# Patient Record
Sex: Male | Born: 1995 | Race: Black or African American | Hispanic: No | Marital: Single | Smoking: Never smoker
Health system: Southern US, Community
[De-identification: ages and names within clinical notes are randomized; demographics above are authoritative.]

---

## 2013-12-30 ENCOUNTER — Emergency Department (HOSPITAL_COMMUNITY)
Admission: EM | Admit: 2013-12-30 | Discharge: 2013-12-30 | Disposition: A | Discharge status: Home / Self Care | Payer: Managed Care, Other (non HMO) | Attending: Emergency Medicine | Admitting: Emergency Medicine

## 2013-12-30 ENCOUNTER — Encounter (HOSPITAL_COMMUNITY): Payer: Self-pay | Admitting: Emergency Medicine

## 2013-12-30 DIAGNOSIS — Y9389 Activity, other specified: Secondary | ICD-10-CM | POA: Diagnosis not present

## 2013-12-30 DIAGNOSIS — W268XXA Contact with other sharp object(s), not elsewhere classified, initial encounter: Secondary | ICD-10-CM | POA: Diagnosis not present

## 2013-12-30 DIAGNOSIS — Y9289 Other specified places as the place of occurrence of the external cause: Secondary | ICD-10-CM | POA: Diagnosis not present

## 2013-12-30 DIAGNOSIS — S86909A Unspecified injury of unspecified muscle(s) and tendon(s) at lower leg level, unspecified leg, initial encounter: Secondary | ICD-10-CM | POA: Insufficient documentation

## 2013-12-30 DIAGNOSIS — S91009A Unspecified open wound, unspecified ankle, initial encounter: Principal | ICD-10-CM

## 2013-12-30 DIAGNOSIS — S81009A Unspecified open wound, unspecified knee, initial encounter: Principal | ICD-10-CM | POA: Insufficient documentation

## 2013-12-30 DIAGNOSIS — S81809A Unspecified open wound, unspecified lower leg, initial encounter: Principal | ICD-10-CM

## 2013-12-30 DIAGNOSIS — S86921A Laceration of unspecified muscle(s) and tendon(s) at lower leg level, right leg, initial encounter: Secondary | ICD-10-CM

## 2013-12-30 DIAGNOSIS — S81811A Laceration without foreign body, right lower leg, initial encounter: Secondary | ICD-10-CM

## 2013-12-30 MED ORDER — LIDOCAINE-EPINEPHRINE (PF) 2 %-1:200000 IJ SOLN
10.0000 mL | Freq: Once | INTRAMUSCULAR | Status: AC
  Administered 2013-12-30: 10 mL
  Filled 2013-12-30: qty 20

## 2013-12-30 MED ORDER — TRAMADOL HCL 50 MG PO TABS
50.0000 mg | ORAL_TABLET | Freq: Four times a day (QID) | ORAL | Status: AC | PRN
Start: 2013-12-30 — End: ?

## 2013-12-30 NOTE — ED Provider Notes (Signed)
CSN: [id number]     Arrival date & time 12/30/13  1758 History  This chart was scribed for non-physician practitioner, Junius Finner, PA-C,working with Richardean Canal, MD, by Karle Plumber, ED Scribe. This patient was seen in room TR10C/TR10C and the patient's care was started at 6:51 PM.  Chief Complaint  Patient presents with  . Extremity Laceration   The history is provided by the patient. No language interpreter was used.   HPI Comments:  Billy Vasquez is a 18 y.o. male who presents to the Emergency Department complaining of a laceration to the anterior right lower extremity that occurred approximately two hours ago. He reports associated bleeding that has since resolved. Pt reports the pain is 0/10. He states he and a friend were "playing around" and he was cut with a straight-edge blade. He denies redness, numbness, tingling or weakness of the RLE. His last tetanus vaccination was two years ago. Pt has no allergies to any medication.  History reviewed. No pertinent past medical history. History reviewed. No pertinent past surgical history. No family history on file. History  Substance Use Topics  . Smoking status: Never Smoker   . Smokeless tobacco: Not on file  . Alcohol Use: No    Review of Systems  Skin: Positive for wound. Negative for color change.  Neurological: Negative for weakness and numbness.  All other systems reviewed and are negative.   Allergies  Review of patient's allergies indicates no known allergies.  Home Medications   Prior to Admission medications   Medication Sig Start Date End Date Taking? Authorizing Provider  traMADol (ULTRAM) 50 MG tablet Take 1 tablet (50 mg total) by mouth every 6 (six) hours as needed. 12/30/13   Junius Finner, PA-C   Triage Vitals: BP 131/85  Pulse 77  Temp(Src) 98.4 F (36.9 C) (Oral)  Resp 15  Ht  (1.727 m)  Wt 190 lb (86.183 kg)  BMI 28.90 kg/m2  SpO2 100% Physical Exam  Nursing note and vitals  reviewed. Constitutional: He is oriented to person, place, and time. He appears well-developed and well-nourished.  HENT:  Head: Normocephalic and atraumatic.  Eyes: EOM are normal.  Neck: Normal range of motion.  Cardiovascular: Normal rate.   Pulmonary/Chest: Effort normal.  Musculoskeletal: Normal range of motion.  Neurological: He is alert and oriented to person, place, and time.  Normal gait.  Skin: Skin is warm and dry.  7 cm laceration to anterior aspect of right lower leg.  Psychiatric: He has a normal mood and affect. His behavior is normal.    ED Course  Procedures (including critical care time) DIAGNOSTIC STUDIES: Oxygen Saturation is 100% on RA, normal by my interpretation.   COORDINATION OF CARE: 6:56 PM- Will suture laceration. Pt verbalizes understanding and agrees to plan.  LACERATION REPAIR PROCEDURE NOTE The patient's identification was confirmed and consent was obtained. This procedure was performed by Junius Finner, PA-C at 6:56 PM. Site: right anterior lower extremity Sterile procedures observed Anesthetic used (type and amt): Lidocaine 2% with Epinephrine (1 mL) Suture type/size: 4-0 Prolene Length: 7 cm # of Sutures: 7 Technique: simple, interrupted Complexity: simple Antibx ointment applied Tetanus UTD Site anesthetized, irrigated with NS, explored without evidence of foreign body, wound well approximated, site covered with dry, sterile dressing.  Patient tolerated procedure well without complications. Instructions for care discussed verbally and patient provided with additional written instructions for homecare and f/u.  Medications  lidocaine-EPINEPHrine (XYLOCAINE W/EPI) 2 %-1:200000 (PF) injection 10 mL (10 mLs  Infiltration Given 12/30/13 1829)   Labs Review Labs Reviewed - No data to display  Imaging Review No results found.   EKG Interpretation None      MDM   Final diagnoses:  Lower leg laceration involving tendon, right,  initial encounter    Pt presenting to ED with laceration to right lower leg.  Laceration able to be sutured closed.  Advised to f/u in 10 days for suture removal and wound recheck. Home care instructions provided. Return precautions provided. Pt verbalized understanding and agreement with tx plan.   I personally performed the services described in this documentation, which was scribed in my presence. The recorded information has been reviewed and is accurate.    Junius Finner, PA-C 12/30/13 2053

## 2013-12-30 NOTE — ED Provider Notes (Signed)
Medical screening examination/treatment/procedure(s) were performed by non-physician practitioner and as supervising physician I was immediately available for consultation/collaboration.   EKG Interpretation None        Kyllian Clingerman H Casimira Sutphin, MD 12/30/13 2349 

## 2013-12-30 NOTE — ED Notes (Signed)
Pt with laceration to rt lower leg from scalpel blade. Bleeding controlled with pressure.

## 2014-07-28 ENCOUNTER — Other Ambulatory Visit: Payer: Self-pay | Admitting: Occupational Medicine

## 2014-07-28 ENCOUNTER — Ambulatory Visit: Payer: Self-pay

## 2014-07-28 DIAGNOSIS — Z0000 Encounter for general adult medical examination without abnormal findings: Secondary | ICD-10-CM

## 2015-09-13 IMAGING — CR DG CHEST 2V
2 series · 2 of 2 positions shown · non-contrast
Comparison: None.

CLINICAL DATA: Mild congestion, cough, physical exam

EXAM:
CHEST  2 VIEW

[view not recorded (1 of 2)]
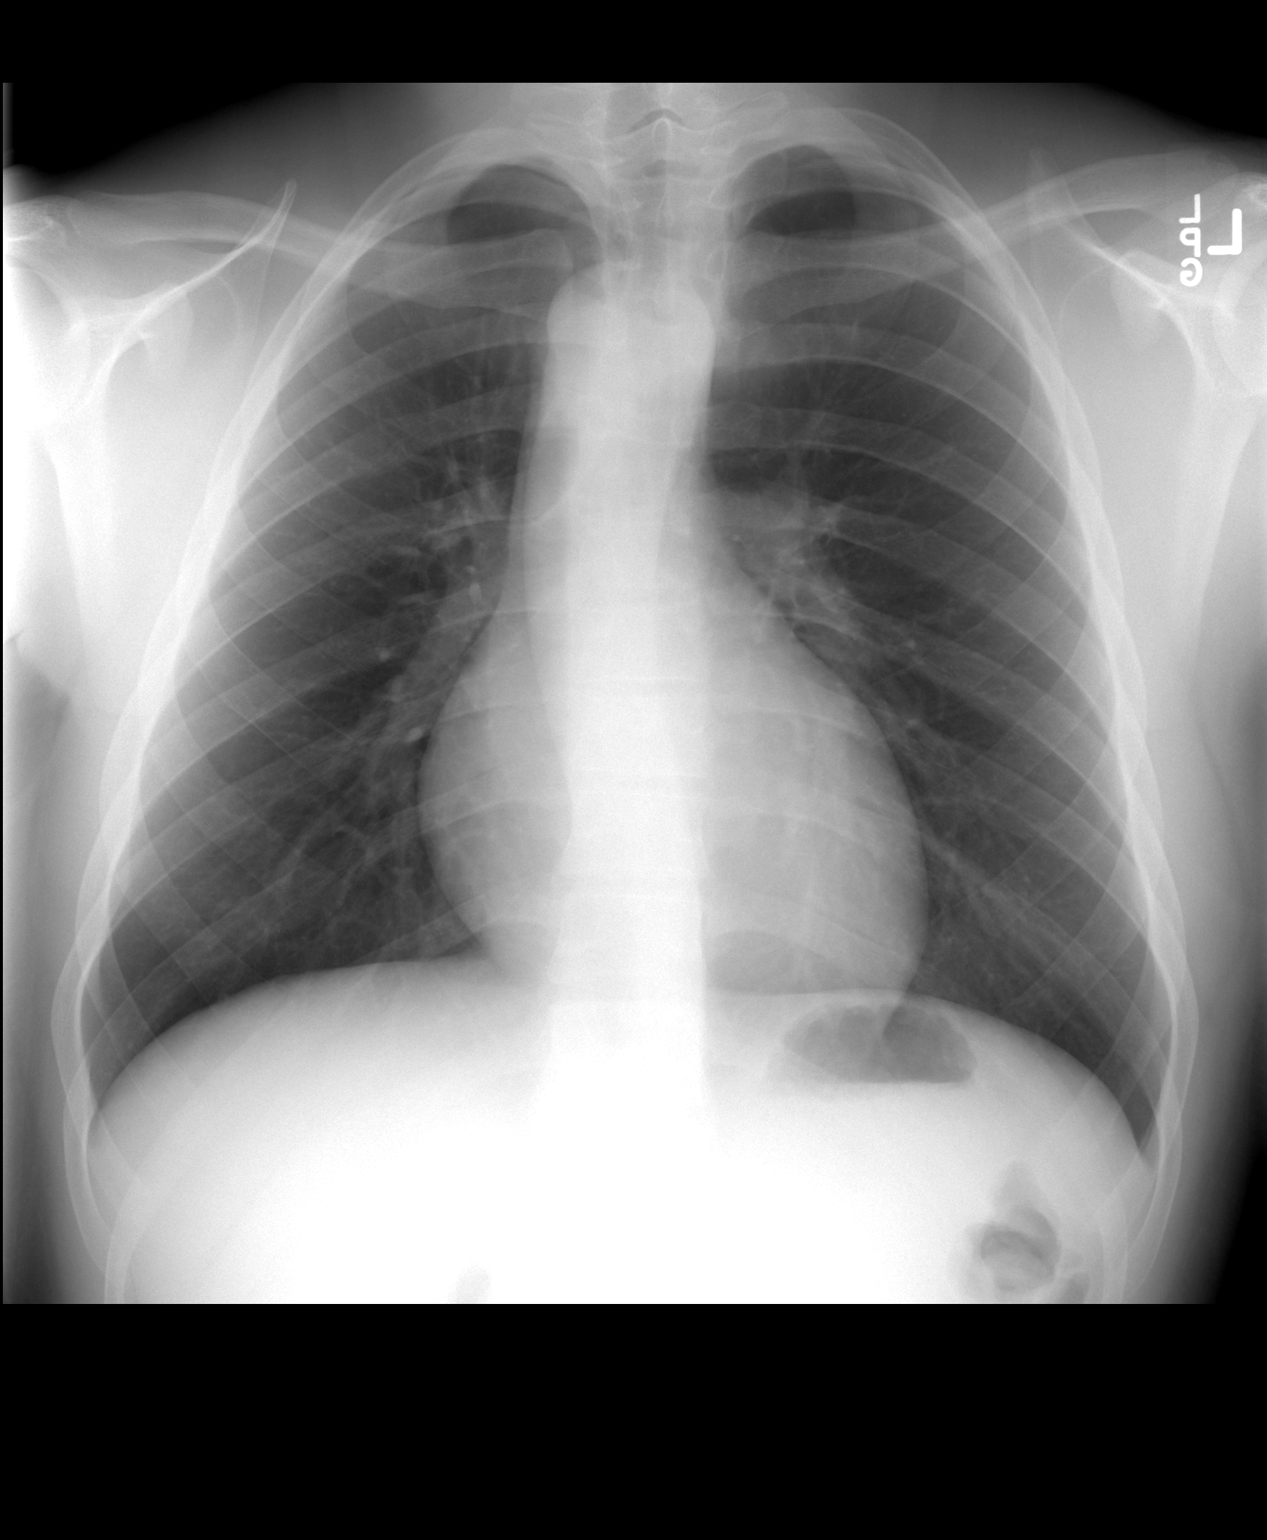

[view not recorded (2 of 2)]
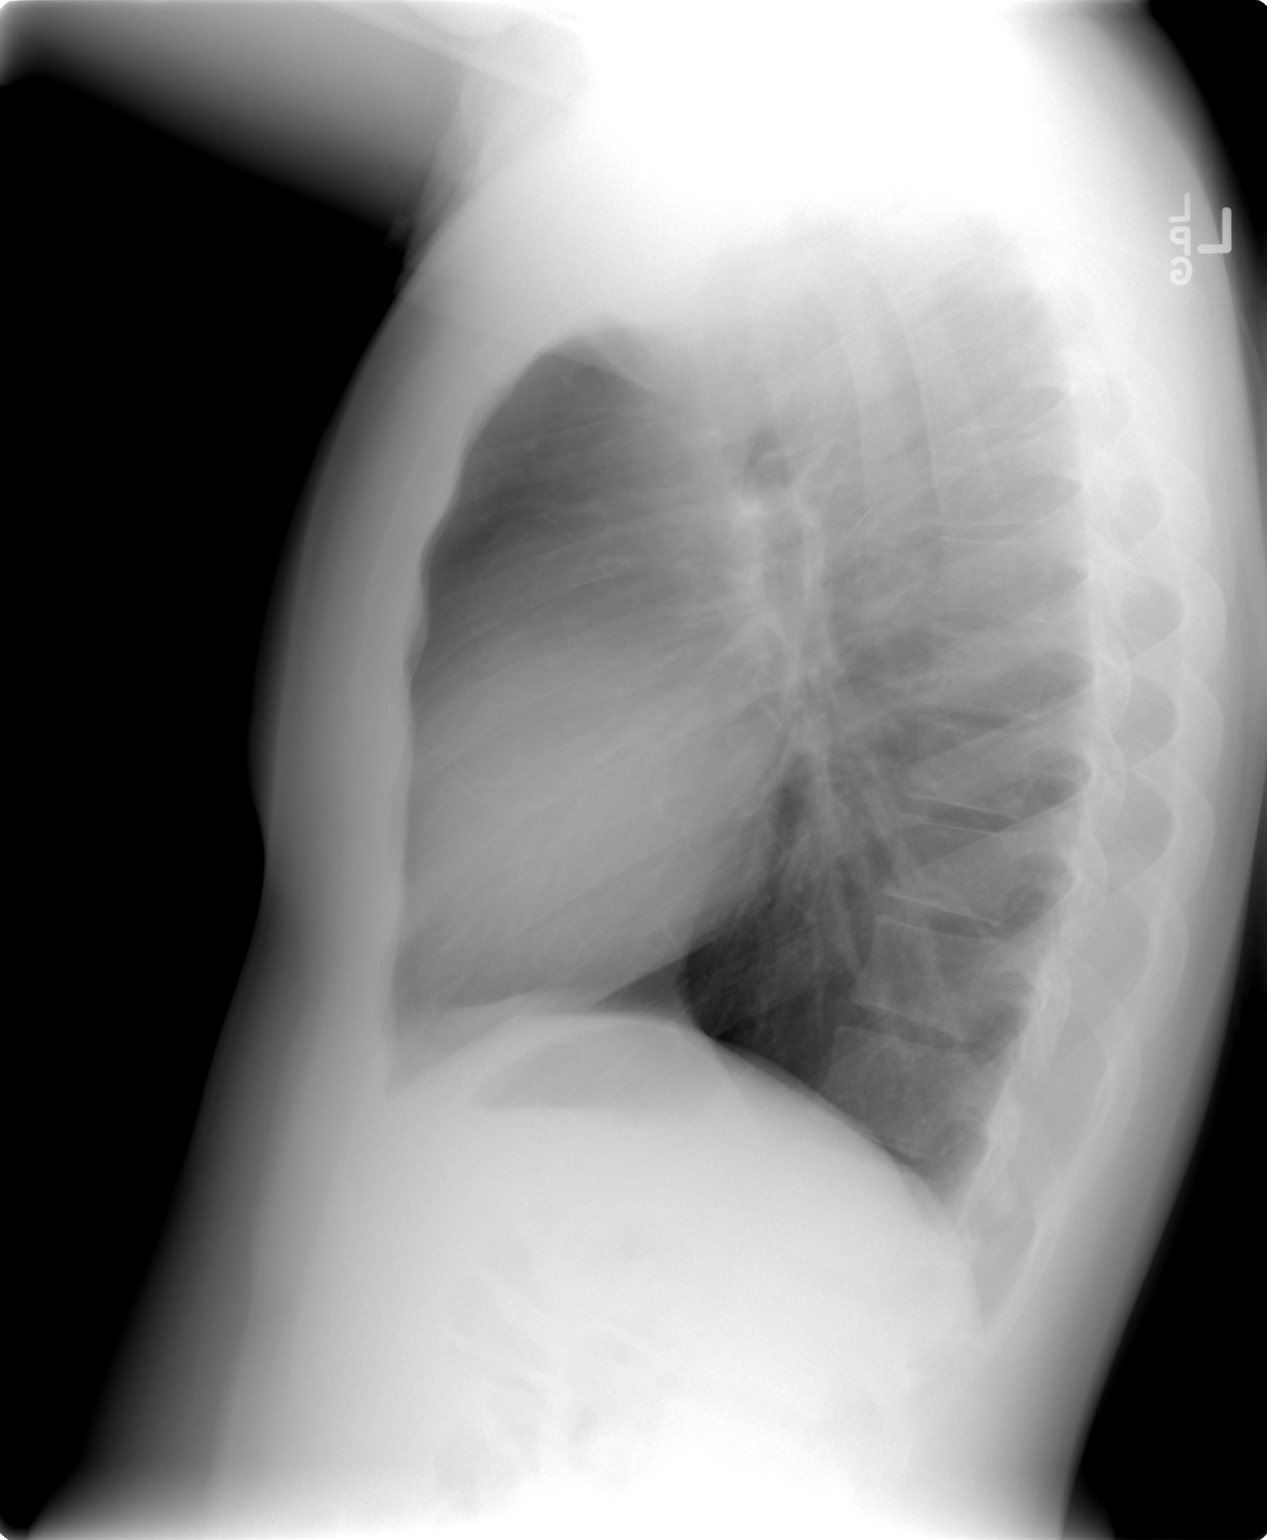

[2 of 2 positions shown; findings below may reference images not displayed]

FINDINGS: There is a variation of a right-sided aortic arch. The apex of the
heart appears to be in normal position on the left. The lungs are
clear. Mediastinal and hilar contours are unremarkable. No bony
abnormality is seen.
IMPRESSION: No active lung disease.  Right-sided aortic arch.
